# Patient Record
Sex: Male | Born: 1965 | Race: Black or African American | Hispanic: No | Marital: Married | Smoking: Light tobacco smoker
Health system: Southern US, Community
[De-identification: ages and names within clinical notes are randomized; demographics above are authoritative.]

---

## 2015-08-07 ENCOUNTER — Emergency Department
Admission: EM | Admit: 2015-08-07 | Discharge: 2015-08-07 | Disposition: A | Discharge status: Home / Self Care | Payer: BLUE CROSS/BLUE SHIELD | Attending: Emergency Medicine | Admitting: Emergency Medicine

## 2015-08-07 ENCOUNTER — Emergency Department: Payer: BLUE CROSS/BLUE SHIELD

## 2015-08-07 ENCOUNTER — Encounter: Payer: Self-pay | Admitting: Emergency Medicine

## 2015-08-07 DIAGNOSIS — G4733 Obstructive sleep apnea (adult) (pediatric): Secondary | ICD-10-CM | POA: Insufficient documentation

## 2015-08-07 DIAGNOSIS — G4700 Insomnia, unspecified: Secondary | ICD-10-CM | POA: Diagnosis present

## 2015-08-07 DIAGNOSIS — F17210 Nicotine dependence, cigarettes, uncomplicated: Secondary | ICD-10-CM | POA: Diagnosis not present

## 2015-08-07 LAB — BASIC METABOLIC PANEL
Anion gap: 9 (ref 5–15)
BUN: 13 mg/dL (ref 6–20)
CALCIUM: 9.6 mg/dL (ref 8.9–10.3)
CO2: 24 mmol/L (ref 22–32)
CREATININE: 1.35 mg/dL — AB (ref 0.61–1.24)
Chloride: 106 mmol/L (ref 101–111)
GFR calc Af Amer: >60 mL/min (ref >60)
GFR calc non Af Amer: >60 mL/min (ref >60)
GLUCOSE: 104 mg/dL — AB (ref 65–99)
Potassium: 4 mmol/L (ref 3.5–5.1)
Sodium: 139 mmol/L (ref 135–145)

## 2015-08-07 LAB — CBC
HCT: 46.3 % (ref 40.0–52.0)
HEMOGLOBIN: 15.1 g/dL (ref 13.0–18.0)
MCH: 26.6 pg (ref 26.0–34.0)
MCHC: 32.7 g/dL (ref 32.0–36.0)
MCV: 81.3 fL (ref 80.0–100.0)
PLATELETS: 249 10*3/uL (ref 150–440)
RBC: 5.69 MIL/uL (ref 4.40–5.90)
RDW: 13.6 % (ref 11.5–14.5)
WBC: 11.7 10*3/uL — ABNORMAL HIGH (ref 3.8–10.6)

## 2015-08-07 LAB — TROPONIN I

## 2015-08-07 NOTE — ED Provider Notes (Signed)
[facility] Emergency Department Provider Note  ____________________________________________  Time seen: Approximately 8:44 PM  I have reviewed the triage vital signs and the nursing notes.   HISTORY  Chief Complaint Shortness of Breath    HPI Edward Robles is a 50 y.o. male with obesity and tobacco use but no other known medical problems who presents for evaluation of persistent issues sleeping.  He is being referred for sleep study by his primary care doctor and being tested for a CPAP for presumed OSA.  He reports that he works third shift and is having a lot of trouble sleeping because he is so concerned about his throat closing off while he is resting.  He has not slept well for several days.  He describes symptoms as severe and nothing makes it better or worse.  He is having active prescribe him some medication that will help.  He denies fever/chills, chest pain, shortness of breath (other than when he is asleep), nausea, vomiting, diarrhea, abdominal pain.   History reviewed. No pertinent past medical history.  There are no active problems to display for this patient.   History reviewed. No pertinent past surgical history.  No current outpatient prescriptions on file.  Allergies Review of patient's allergies indicates no known allergies.  No family history on file.  Social History Social History  Substance Use Topics  . Smoking status: Light Tobacco Smoker -- 0.25 packs/day    Types: Cigarettes  . Smokeless tobacco: None  . Alcohol Use: Yes     Comment: occas.     Review of Systems Constitutional: No fever/chills Eyes: No visual changes. ENT: No sore throat. Cardiovascular: Denies chest pain. Respiratory: Denies shortness of breath except at night (presumed OSA) Gastrointestinal: No abdominal pain.  No nausea, no vomiting.  No diarrhea.  No constipation. Genitourinary: Negative for dysuria. Musculoskeletal: Negative for back  pain. Skin: Negative for rash. Neurological: Negative for headaches, focal weakness or numbness.  10-point ROS otherwise negative.  ____________________________________________   PHYSICAL EXAM:  VITAL SIGNS: ED Triage Vitals  Enc Vitals Group     BP 08/07/15 1751 148/83 mmHg     Pulse Rate 08/07/15 1751 60     Resp 08/07/15 1751 18     Temp 08/07/15 1751 98.2 F (36.8 C)     Temp Source 08/07/15 1751 Oral     SpO2 08/07/15 1751 96 %     Weight 08/07/15 1751 260 lb (117.935 kg)     Height 08/07/15 1751  (1.702 m)     Head Cir --      Peak Flow --      Pain Score --      Pain Loc --      Pain Edu? --      Excl. in GC? --     Constitutional: Alert and oriented. Well appearing and in no acute distress. Eyes: Conjunctivae are normal. PERRL. EOMI. Head: Atraumatic. Nose: No congestion/rhinnorhea. Mouth/Throat: Mucous membranes are moist.  Oropharynx non-erythematous. Neck: No stridor.  No meningeal signs.   Cardiovascular: Normal rate, regular rhythm. Good peripheral circulation. Grossly normal heart sounds.   Respiratory: Normal respiratory effort.  No retractions. Lungs CTAB. Gastrointestinal: Soft and nontender. No distention.  Musculoskeletal: No lower extremity tenderness nor edema. No gross deformities of extremities. Neurologic:  Normal speech and language. No gross focal neurologic deficits are appreciated.  Skin:  Skin is warm, dry and intact. No rash noted. Psychiatric: Mood and affect are normal. Speech and  behavior are normal.  ____________________________________________   LABS (all labs ordered are listed, but only abnormal results are displayed)  Labs Reviewed  BASIC METABOLIC PANEL - Abnormal; Notable for the following:    Glucose, Bld 104 (*)    Creatinine, Ser 1.35 (*)    All other components within normal limits  CBC - Abnormal; Notable for the following:    WBC 11.7 (*)    All other components within normal limits  TROPONIN I    ____________________________________________  EKG  ED ECG REPORT I, Claris Pech, the attending physician, personally viewed and interpreted this ECG.  Date: 08/07/2015 EKG Time: 18:00 Rate: 60 Rhythm: normal sinus rhythm QRS Axis: normal Intervals: normal ST/T Wave abnormalities: normal Conduction Disturbances: none Narrative Interpretation: unremarkable  ____________________________________________  RADIOLOGY   Dg Chest 2 View  08/07/2015  CLINICAL DATA:  Shortness of breath with sleep, now it has been happening during the day EXAM: CHEST  2 VIEW COMPARISON:  None. FINDINGS: The heart size and mediastinal contours are within normal limits. Both lungs are clear. The visualized skeletal structures are unremarkable. IMPRESSION: No active cardiopulmonary disease. Electronically Signed   By: Elige KoHetal  Patel   On: 08/07/2015 18:21    ____________________________________________   PROCEDURES  Procedure(s) performed: None  Critical Care performed: No ____________________________________________   INITIAL IMPRESSION / ASSESSMENT AND PLAN / ED COURSE  Pertinent labs & imaging results that were available during my care of the patient were reviewed by me and considered in my medical decision making (see chart for details).  The patient has no acute or emergent medical issues at this time.  I explained to him that he will feel much better after getting his CPAP and finishing a sleep study but there is no indication for any medication or other intervention by me at this time.  I explained that in my opinion it would be irresponsible of me to provide a sedative given the presumed OSA and that he should sleep and a lazy boy or upright in a chair as much as possible and follow-up with his primary care doctor for additional management.  He understands and agrees with the plan.   ____________________________________________  FINAL CLINICAL IMPRESSION(S) / ED DIAGNOSES  Final  diagnoses:  Obstructive sleep apnea     MEDICATIONS GIVEN DURING THIS VISIT:  Medications - No data to display   NEW OUTPATIENT MEDICATIONS STARTED DURING THIS VISIT:  There are no discharge medications for this patient.     Note:  This document was prepared using Dragon voice recognition software and may include unintentional dictation errors.   Loleta Roseory Emree Locicero, MD 08/07/15 2123

## 2015-08-07 NOTE — ED Notes (Signed)
Pt states he has been having episodes of shob with sleep, however, the past few days, its been happening during the day as well. Pt states he is not getting a full breath, and it comes during rest and activity. Has had one sleep study, does not currently have cpap at night, however, another sleep study has been ordered. Pt appears in no distress at this time.

## 2015-08-07 NOTE — Discharge Instructions (Signed)
Your workup was reassuring today.  We feel it is most appropriate that you follow-up with your primary care doctor and sleep study doctor to discuss additional management including medication; it would not be appropriate for [place] to prescribe sedatives from the emergency department.  Return to the emergency department with new or worsening symptoms that concern you.   Sleep Apnea  Sleep apnea is a sleep disorder characterized by abnormal pauses in breathing while you sleep. When your breathing pauses, the level of oxygen in your blood decreases. This causes you to move out of deep sleep and into light sleep. As a result, your quality of sleep is poor, and the system that carries your blood throughout your body (cardiovascular system) experiences stress. If sleep apnea remains untreated, the following conditions can develop:  High blood pressure (hypertension).  Coronary artery disease.  Inability to achieve or maintain an erection (impotence).  Impairment of your thought process (cognitive dysfunction). There are three types of sleep apnea:  Obstructive sleep apnea--Pauses in breathing during sleep because of a blocked airway.  Central sleep apnea--Pauses in breathing during sleep because the area of the brain that controls your breathing does not send the correct signals to the muscles that control breathing.  Mixed sleep apnea--A combination of both obstructive and central sleep apnea. RISK FACTORS The following risk factors can increase your risk of developing sleep apnea:  Being overweight.  Smoking.  Having narrow passages in your nose and throat.  Being of older age.  Being male.  Alcohol use.  Sedative and tranquilizer use.  Ethnicity. Among individuals younger than 35 years, African Americans are at increased risk of sleep apnea. SYMPTOMS   Difficulty staying asleep.  Daytime sleepiness and fatigue.  Loss of energy.  Irritability.  Loud, heavy  snoring.  Morning headaches.  Trouble concentrating.  Forgetfulness.  Decreased interest in sex.  Unexplained sleepiness. DIAGNOSIS  In order to diagnose sleep apnea, your caregiver will perform a physical examination. A sleep study done in the comfort of your own home may be appropriate if you are otherwise healthy. Your caregiver may also recommend that you spend the night in a sleep lab. In the sleep lab, several monitors record information about your heart, lungs, and brain while you sleep. Your leg and arm movements and blood oxygen level are also recorded. TREATMENT The following actions may help to resolve mild sleep apnea:  Sleeping on your side.   Using a decongestant if you have nasal congestion.   Avoiding the use of depressants, including alcohol, sedatives, and narcotics.   Losing weight and modifying your diet if you are overweight. There also are devices and treatments to help open your airway:  Oral appliances. These are custom-made mouthpieces that shift your lower jaw forward and slightly open your bite. This opens your airway.  Devices that create positive airway pressure. This positive pressure "splints" your airway open to help you breathe better during sleep. The following devices create positive airway pressure:  Continuous positive airway pressure (CPAP) device. The CPAP device creates a continuous level of air pressure with an air pump. The air is delivered to your airway through a mask while you sleep. This continuous pressure keeps your airway open.  Nasal expiratory positive airway pressure (EPAP) device. The EPAP device creates positive air pressure as you exhale. The device consists of single-use valves, which are inserted into each nostril and held in place by adhesive. The valves create very little resistance when you inhale but create much  more resistance when you exhale. That increased resistance creates the positive airway pressure. This positive  pressure while you exhale keeps your airway open, making it easier to breath when you inhale again.  Bilevel positive airway pressure (BPAP) device. The BPAP device is used mainly in patients with central sleep apnea. This device is similar to the CPAP device because it also uses an air pump to deliver continuous air pressure through a mask. However, with the BPAP machine, the pressure is set at two different levels. The pressure when you exhale is lower than the pressure when you inhale.  Surgery. Typically, surgery is only done if you cannot comply with less invasive treatments or if the less invasive treatments do not improve your condition. Surgery involves removing excess tissue in your airway to create a wider passage way.   This information is not intended to replace advice given to you by your health care provider. Make sure you discuss any questions you have with your health care provider.   Document Released: 03/14/2002 Document Revised: 04/14/2014 Document Reviewed: 07/31/2011 Elsevier Interactive Patient Education 2016 [employer]  CPAP and BIPAP Information CPAP and BIPAP are methods of helping you breathe with the use of air pressure. CPAP stands for "continuous positive airway pressure." BIPAP stands for "bi-level positive airway pressure." In both methods, air is blown into your air passages to help keep you breathing well. With CPAP, the amount of pressure stays the same while you breathe in and out. CPAP is most commonly used for obstructive sleep apnea. For obstructive sleep apnea, CPAP works by holding your airways open so that they do not collapse when your muscles relax during sleep. BIPAP is similar to CPAP except the amount of pressure is increased when you inhale. This helps you take larger breaths. Your health care provider will recommend whether CPAP or BIPAP would be more helpful for you.  WHY ARE CPAP AND BIPAP TREATMENTS USED? CPAP or BIPAP can be helpful if you have:    Sleep apnea.   Chronic obstructive pulmonary disease (COPD).   Diseases that weaken the muscles of the chest, including muscular dystrophy or neurological diseases such as amyotrophic lateral sclerosis (ALS).  Other problems that cause breathing to be weak, abnormal, or difficult.  HOW IS CPAP OR BIPAP ADMINISTERED? Both CPAP and BIPAP are provided by a small machine with a flexible plastic tube that attaches to a plastic mask. The mask fits on your face, and air is blown into your air passages through your nose or mouth. The amount of pressure that is used to blow the air into your air passages can be set on the machine. Your health care provider will determine the pressure setting that should be used based on your individual needs. WHEN SHOULD CPAP OR BIPAP BE USED? In most cases, the mask is worn only when sleeping. Generally, you will need to wear the mask throughout the night and during the daytime if you take a nap. In a few cases involving certain medical conditions, people also need to wear the mask at other times when they are awake. Follow your health care provider's instructions for when to use the machine.  USING THE MASK  Because the mask needs to be snug, some people feel a trapped or closed-in feeling (claustrophobic) when first using the mask. You may need to get used to the mask gradually. To do this, you can first hold the mask loosely over your nose or mouth. Gradually apply the mask more  snugly. You can also gradually increase the amount of time that you use the mask.  Masks are available in various types and sizes. Some fit over your mouth and nose, and some fit over just your nose. If your mask does not fit well, talk to your health care provider about getting a different one.  If you are using a nasal mask and you tend to breathe through your mouth, a chin strap may be applied to help keep your mouth closed.   The CPAP and BIPAP machines have alarms that may sound if  the mask comes off or develops a leak.  If you have trouble with the mask, it is very important that you talk to your health care provider about finding a way to make the mask easier to tolerate. Do not stop using the mask. This could have a negative impact on your health. TIPS FOR USING THE MACHINE  Place your CPAP or BIPAP machine on a secure table or stand near an electrical outlet.   Know where the on-off switch is located on the machine.  Follow your health care provider's instructions for how to set the pressure on your machine and when you should use it.  Do not eat or drink while the CPAP or BIPAP machine is on. Food or fluids could get pushed into your lungs by the pressure of the CPAP or BIPAP.  Do not smoke. Tobacco smoke residue can damage the machine.   For home use, CPAP and BIPAP machines can be rented or purchased through home health care companies. Many different brands of machines are available. Renting a machine before purchasing may help you find out which particular machine works well for you. SEEK IMMEDIATE MEDICAL CARE IF:  You have redness or open areas around your nose or mouth where the mask fits.   You have trouble operating the CPAP or BIPAP machine.   You cannot tolerate wearing the CPAP or BIPAP mask.    This information is not intended to replace advice given to you by your health care provider. Make sure you discuss any questions you have with your health care provider.   Document Released: 12/21/2003 Document Revised: 04/14/2014 Document Reviewed: 10/21/2012 Elsevier Interactive Patient Education [employer]

## 2016-12-05 IMAGING — CR DG CHEST 2V
1 series · 2 of 2 positions shown · non-contrast
Comparison: None.

CLINICAL DATA: Shortness of breath with sleep, now it has been
happening during the day

EXAM:
CHEST  2 VIEW

[Series 1: w chest pa · 0.14mm/px · 2 of 2 slices shown]
[im 1/2]
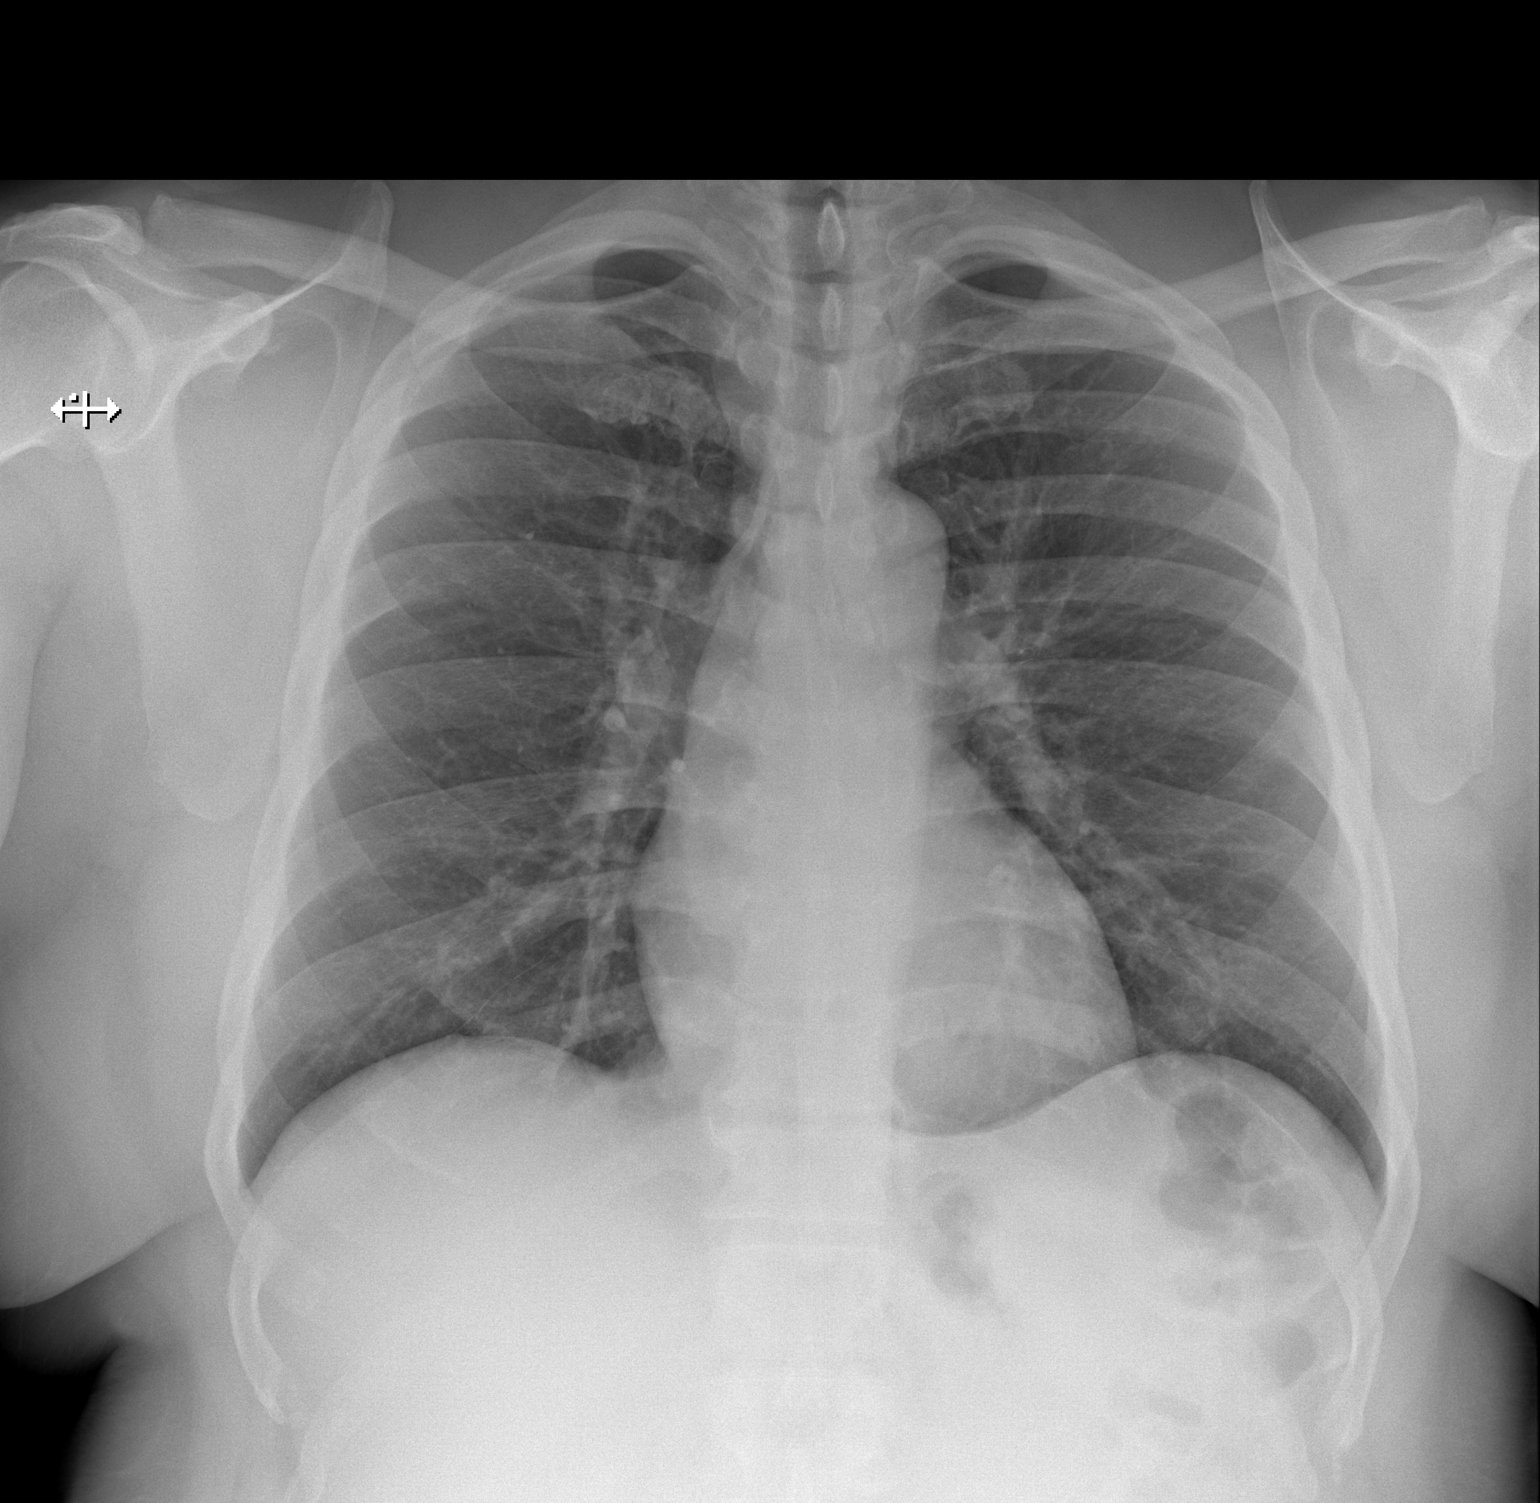
[im 2/2]
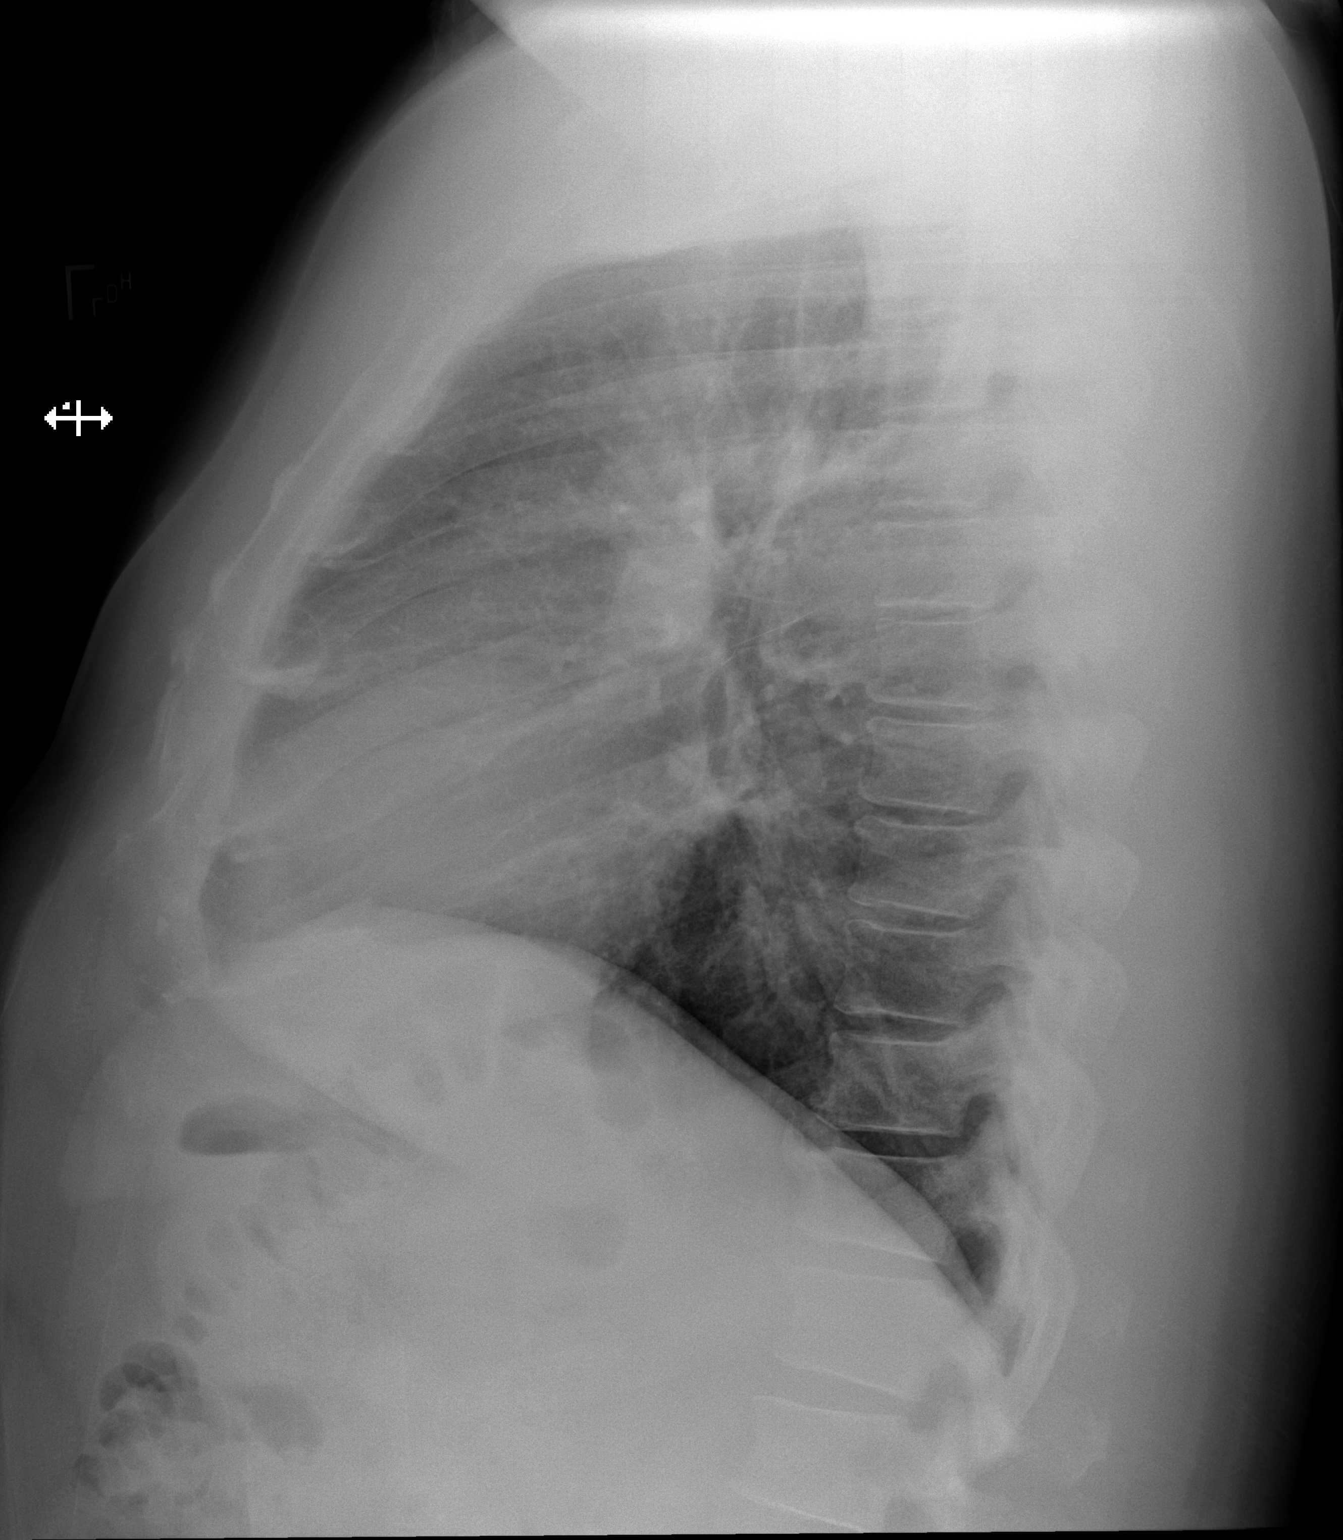

[2 of 2 positions shown; findings below may reference images not displayed]

FINDINGS: The heart size and mediastinal contours are within normal limits.
Both lungs are clear. The visualized skeletal structures are
unremarkable.
IMPRESSION: No active cardiopulmonary disease.
